# Patient Record
Sex: Female | Born: 1990 | Race: White | Hispanic: No | Marital: Single | Smoking: Never smoker
Health system: Southern US, Community
[De-identification: ages and names within clinical notes are randomized; demographics above are authoritative.]

## PROBLEM LIST (undated history)

## (undated) DIAGNOSIS — F32A Depression, unspecified: Secondary | ICD-10-CM

## (undated) DIAGNOSIS — F419 Anxiety disorder, unspecified: Secondary | ICD-10-CM

## (undated) DIAGNOSIS — E663 Overweight: Secondary | ICD-10-CM

## (undated) DIAGNOSIS — I10 Essential (primary) hypertension: Secondary | ICD-10-CM

## (undated) DIAGNOSIS — J45909 Unspecified asthma, uncomplicated: Secondary | ICD-10-CM

## (undated) HISTORY — PX: LAPAROSCOPIC GASTRIC SLEEVE RESECTION: SHX5895

## (undated) HISTORY — DX: Overweight: E66.3

## (undated) HISTORY — DX: Anxiety disorder, unspecified: F41.9

## (undated) HISTORY — DX: Depression, unspecified: F32.A

## (undated) HISTORY — PX: LASIK: SHX215

## (undated) HISTORY — DX: Essential (primary) hypertension: I10

## (undated) HISTORY — DX: Unspecified asthma, uncomplicated: J45.909

---
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
## 2012-04-22 ENCOUNTER — Other Ambulatory Visit: Payer: Self-pay

## 2012-04-22 DIAGNOSIS — R1011 Right upper quadrant pain: Secondary | ICD-10-CM

## 2012-04-23 ENCOUNTER — Ambulatory Visit
Admission: RE | Admit: 2012-04-23 | Discharge: 2012-04-23 | Disposition: A | Discharge status: Home / Self Care | Payer: BC Managed Care – PPO | Source: Ambulatory Visit

## 2012-04-23 DIAGNOSIS — R1011 Right upper quadrant pain: Secondary | ICD-10-CM

## 2015-05-24 ENCOUNTER — Other Ambulatory Visit (HOSPITAL_COMMUNITY)
Admission: RE | Admit: 2015-05-24 | Discharge: 2015-05-24 | Disposition: A | Discharge status: Home / Self Care | Payer: Managed Care, Other (non HMO) | Source: Ambulatory Visit | Attending: Obstetrics & Gynecology | Admitting: Obstetrics & Gynecology

## 2015-05-24 ENCOUNTER — Other Ambulatory Visit: Payer: Self-pay | Admitting: Obstetrics & Gynecology

## 2015-05-24 DIAGNOSIS — Z01419 Encounter for gynecological examination (general) (routine) without abnormal findings: Secondary | ICD-10-CM | POA: Insufficient documentation

## 2015-05-24 DIAGNOSIS — Z113 Encounter for screening for infections with a predominantly sexual mode of transmission: Secondary | ICD-10-CM | POA: Insufficient documentation

## 2015-06-09 LAB — CYTOLOGY - PAP

## 2015-08-20 ENCOUNTER — Other Ambulatory Visit: Payer: Self-pay | Admitting: Gynecology

## 2015-08-20 DIAGNOSIS — I10 Essential (primary) hypertension: Secondary | ICD-10-CM

## 2015-08-20 DIAGNOSIS — J4520 Mild intermittent asthma, uncomplicated: Secondary | ICD-10-CM

## 2015-08-27 ENCOUNTER — Other Ambulatory Visit: Payer: Self-pay | Admitting: Gynecology

## 2015-08-27 ENCOUNTER — Ambulatory Visit
Admission: RE | Admit: 2015-08-27 | Discharge: 2015-08-27 | Disposition: A | Discharge status: Home / Self Care | Payer: Managed Care, Other (non HMO) | Source: Ambulatory Visit | Attending: Gynecology | Admitting: Gynecology

## 2015-08-27 DIAGNOSIS — I10 Essential (primary) hypertension: Secondary | ICD-10-CM

## 2015-08-27 DIAGNOSIS — J4520 Mild intermittent asthma, uncomplicated: Secondary | ICD-10-CM

## 2016-07-06 DIAGNOSIS — Z903 Acquired absence of stomach [part of]: Secondary | ICD-10-CM | POA: Diagnosis not present

## 2016-07-17 DIAGNOSIS — J4521 Mild intermittent asthma with (acute) exacerbation: Secondary | ICD-10-CM | POA: Diagnosis not present

## 2016-07-17 DIAGNOSIS — J40 Bronchitis, not specified as acute or chronic: Secondary | ICD-10-CM | POA: Diagnosis not present

## 2016-10-25 ENCOUNTER — Encounter: Payer: Self-pay | Admitting: Gynecology

## 2016-10-28 IMAGING — RF DG UGI W/ KUB
18 of 21 series · 18 of 21 positions shown · non-contrast
Comparison: None.

CLINICAL DATA: Preop for gastric sleeve procedure.

EXAM:
UPPER GI SERIES WITH KUB
TECHNIQUE: After obtaining a scout radiograph a routine upper GI series was
performed using thin and high density barium.
FLUOROSCOPY TIME:  Radiation Exposure Index (as provided by the
fluoroscopic device): 160 dGy cm2
If the device does not provide the exposure index:
Fluoroscopy Time (in minutes and seconds):  2 minutes and 36 seconds
Number of Acquired Images:

[Series 1: run · 1 of 1 slices shown (1 of 17)]
[im 1/1]
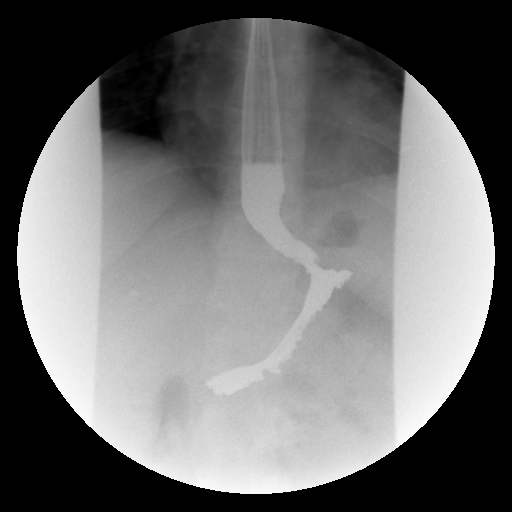

[Series 2: run · 1 of 1 slices shown (2 of 17)]
[im 1/1]
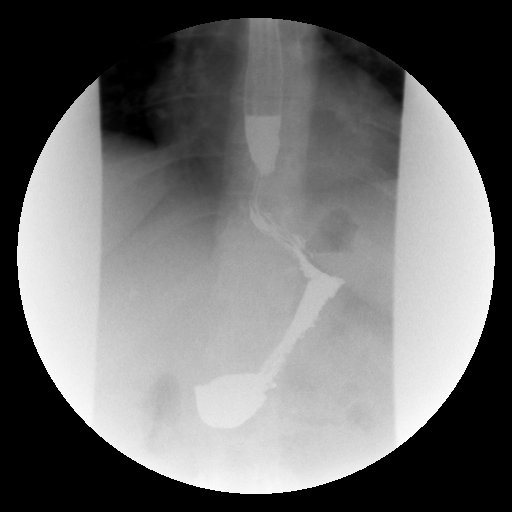

[Series 3: run · 1 of 1 slices shown (3 of 17)]
[im 1/1]
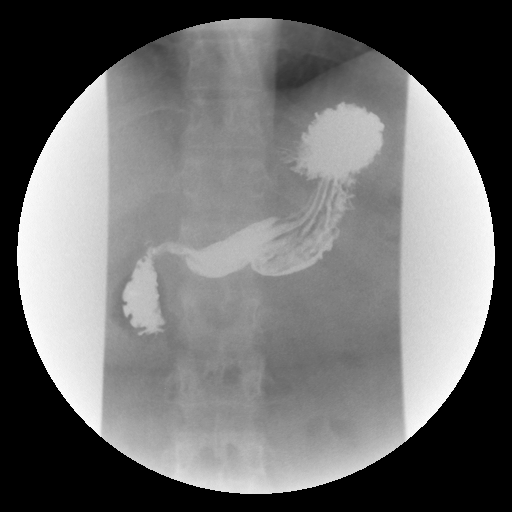

[Series 5: run · 1 of 1 slices shown (4 of 17)]
[im 1/1]
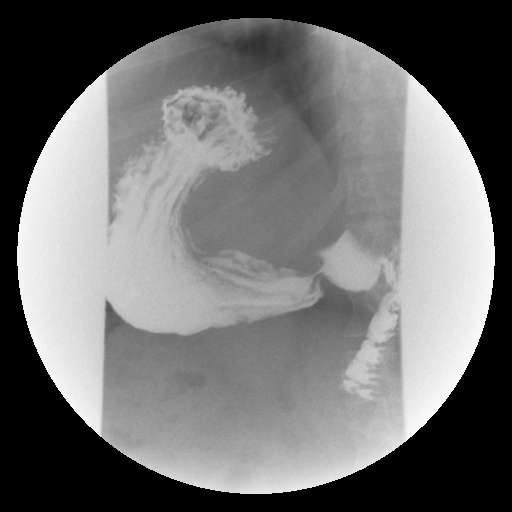

[Series 6: run · 1 of 1 slices shown (5 of 17)]
[im 1/1]
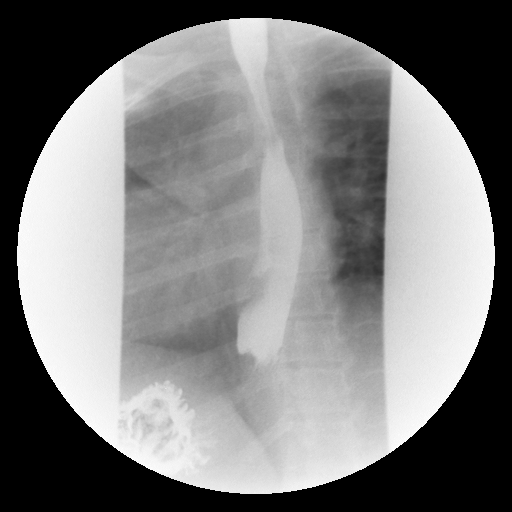

[Series 7: run · 1 of 1 slices shown (6 of 17)]
[im 1/1]
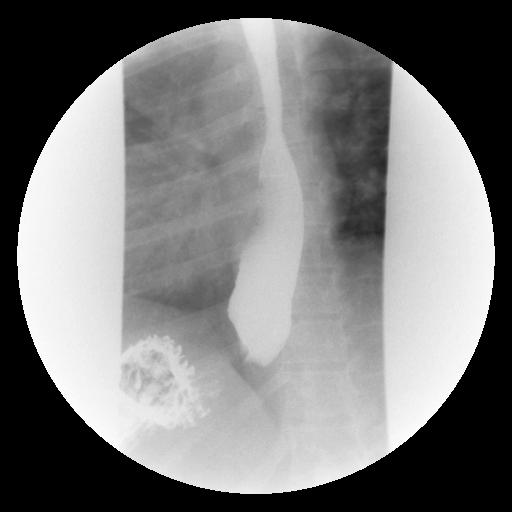

[Series 8: run · 1 of 1 slices shown (7 of 17)]
[im 1/1]
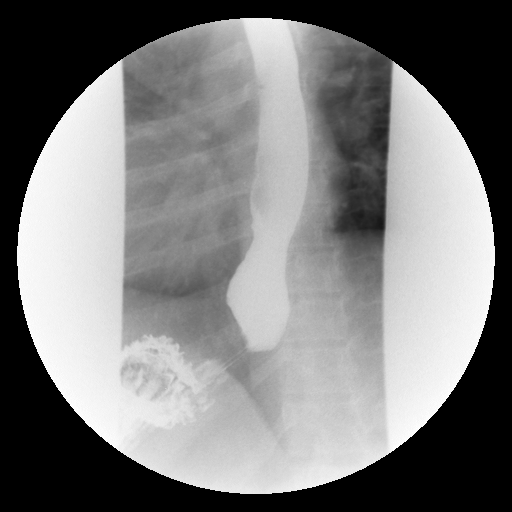

[Series 9: run · 1 of 1 slices shown (8 of 17)]
[im 1/1]
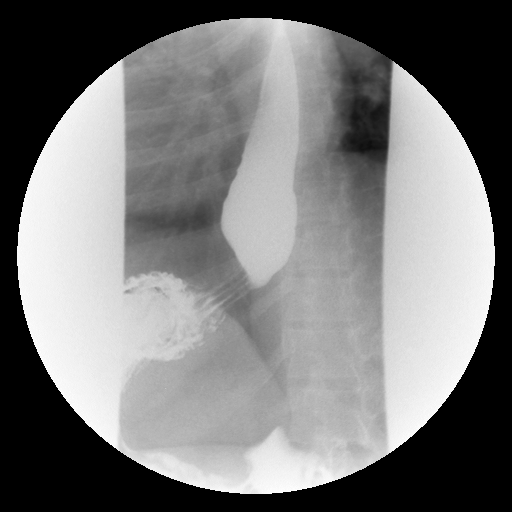

[Series 10: run · 1 of 1 slices shown (9 of 17)]
[im 1/1]
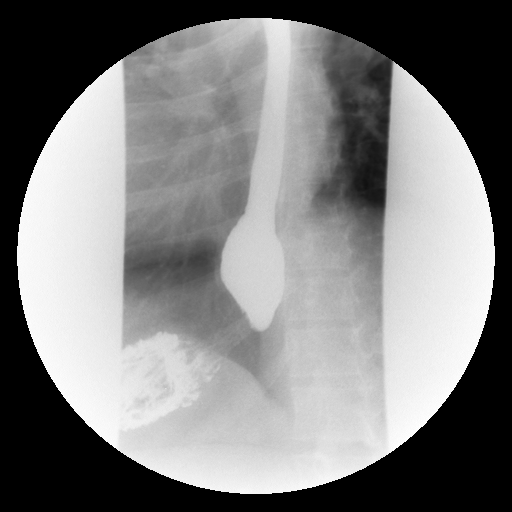

[Series 12: run · 1 of 1 slices shown (10 of 17)]
[im 1/1]
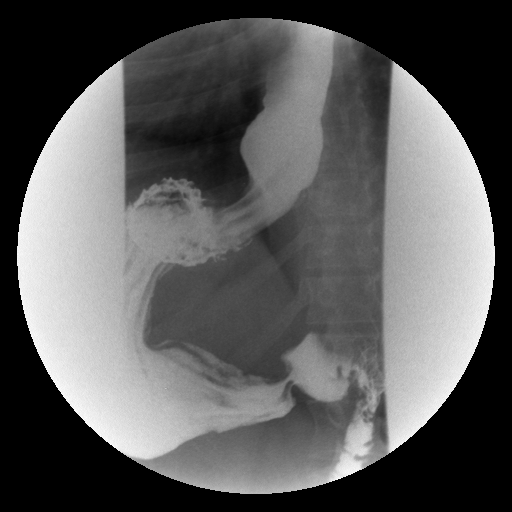

[Series 13: run · 1 of 1 slices shown (11 of 17)]
[im 1/1]
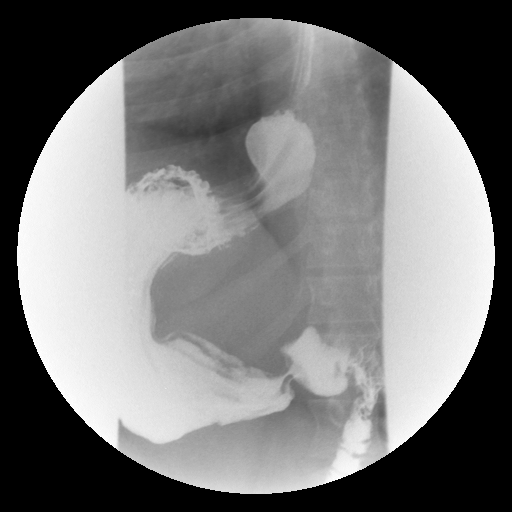

[Series 14: run · 1 of 1 slices shown (12 of 17)]
[im 1/1]
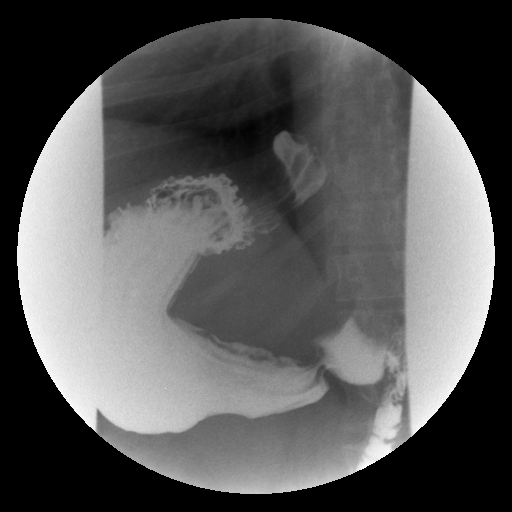

[Series 15: run · 1 of 1 slices shown (13 of 17)]
[im 1/1]
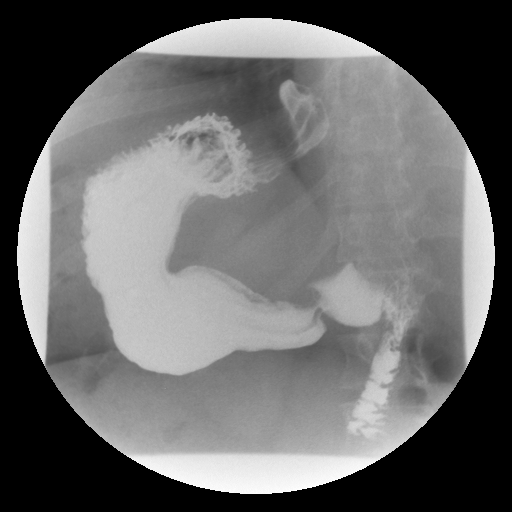

[Series 16: run · 1 of 1 slices shown (14 of 17)]
[im 1/1]
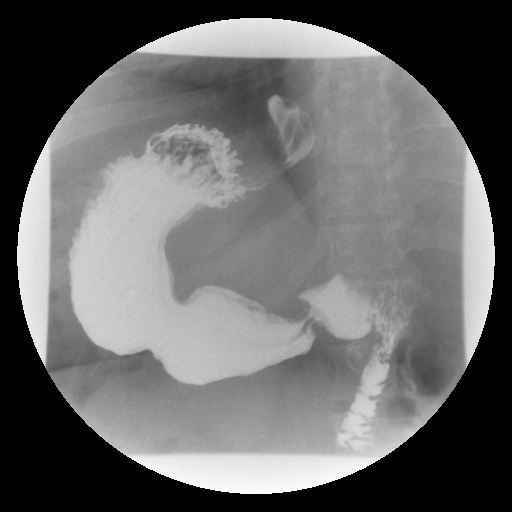

[Series 17: run · 1 of 1 slices shown (15 of 17)]
[im 1/1]
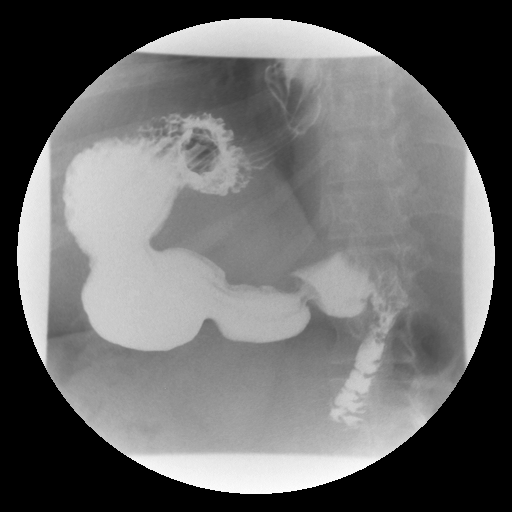

[Series 19: run · 1 of 1 slices shown (16 of 17)]
[im 1/1]
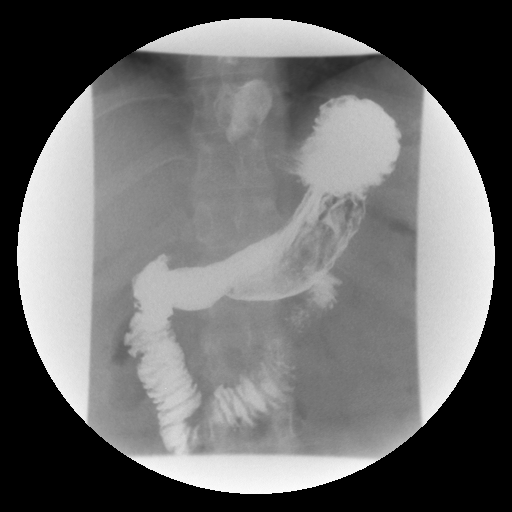

[Series 20: run · 1 of 1 slices shown (17 of 17)]
[im 1/1]
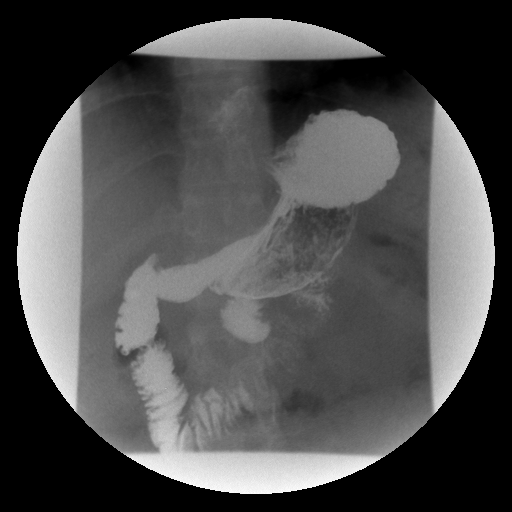

[Series 1001: view not recorded · 0.20mm/px · 1 of 1 slices shown]
[im 1/1]
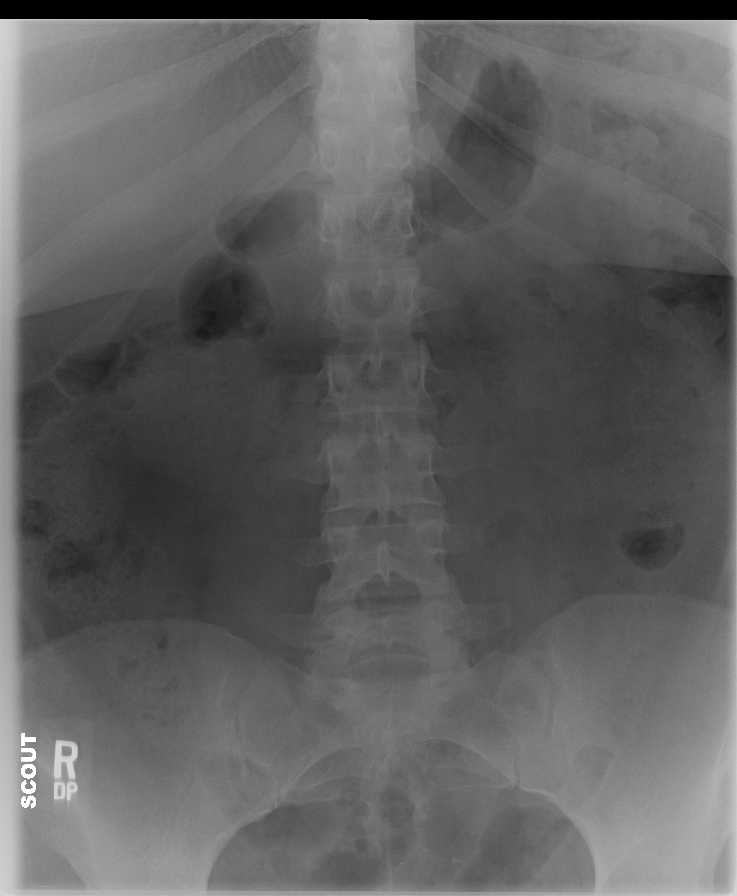

[18 of 21 positions shown; findings below may reference images not displayed]

FINDINGS: Normal esophageal motility. No intrinsic or extrinsic lesions of the
esophagus were identified. No mucosal abnormalities. There is a
small sliding-type hiatal hernia with inducible GE reflux.

The stomach, duodenum bulb and C-loop are normal. The duodenum
jejunal junction is in its normal anatomic location.
IMPRESSION: 1. Small sliding-type hiatal hernia with inducible GE reflux.
2. Normal stomach and duodenum.

## 2016-10-30 DIAGNOSIS — K5900 Constipation, unspecified: Secondary | ICD-10-CM | POA: Diagnosis not present

## 2016-11-13 DIAGNOSIS — Z903 Acquired absence of stomach [part of]: Secondary | ICD-10-CM | POA: Diagnosis not present

## 2016-11-19 DIAGNOSIS — J020 Streptococcal pharyngitis: Secondary | ICD-10-CM | POA: Diagnosis not present

## 2017-01-22 DIAGNOSIS — J029 Acute pharyngitis, unspecified: Secondary | ICD-10-CM | POA: Diagnosis not present

## 2017-01-22 DIAGNOSIS — J028 Acute pharyngitis due to other specified organisms: Secondary | ICD-10-CM | POA: Diagnosis not present

## 2017-08-03 DIAGNOSIS — Z01419 Encounter for gynecological examination (general) (routine) without abnormal findings: Secondary | ICD-10-CM | POA: Diagnosis not present

## 2017-09-05 DIAGNOSIS — Z0000 Encounter for general adult medical examination without abnormal findings: Secondary | ICD-10-CM | POA: Diagnosis not present

## 2017-09-25 DIAGNOSIS — R632 Polyphagia: Secondary | ICD-10-CM | POA: Diagnosis not present

## 2017-09-25 DIAGNOSIS — Z9884 Bariatric surgery status: Secondary | ICD-10-CM | POA: Diagnosis not present

## 2017-11-09 DIAGNOSIS — Z9884 Bariatric surgery status: Secondary | ICD-10-CM | POA: Diagnosis not present

## 2017-11-09 DIAGNOSIS — Z713 Dietary counseling and surveillance: Secondary | ICD-10-CM | POA: Diagnosis not present

## 2018-01-02 DIAGNOSIS — J039 Acute tonsillitis, unspecified: Secondary | ICD-10-CM | POA: Diagnosis not present

## 2018-02-01 DIAGNOSIS — Z9884 Bariatric surgery status: Secondary | ICD-10-CM | POA: Diagnosis not present

## 2018-04-05 DIAGNOSIS — Z903 Acquired absence of stomach [part of]: Secondary | ICD-10-CM | POA: Diagnosis not present

## 2018-04-05 DIAGNOSIS — Z9884 Bariatric surgery status: Secondary | ICD-10-CM | POA: Diagnosis not present

## 2018-07-25 DIAGNOSIS — I10 Essential (primary) hypertension: Secondary | ICD-10-CM | POA: Diagnosis not present

## 2018-07-25 DIAGNOSIS — M545 Low back pain: Secondary | ICD-10-CM | POA: Diagnosis not present

## 2018-07-25 DIAGNOSIS — F411 Generalized anxiety disorder: Secondary | ICD-10-CM | POA: Diagnosis not present

## 2018-08-09 DIAGNOSIS — Z713 Dietary counseling and surveillance: Secondary | ICD-10-CM | POA: Diagnosis not present

## 2018-08-09 DIAGNOSIS — Z9884 Bariatric surgery status: Secondary | ICD-10-CM | POA: Diagnosis not present

## 2018-08-09 DIAGNOSIS — R635 Abnormal weight gain: Secondary | ICD-10-CM | POA: Diagnosis not present

## 2018-08-09 DIAGNOSIS — R030 Elevated blood-pressure reading, without diagnosis of hypertension: Secondary | ICD-10-CM | POA: Diagnosis not present

## 2021-12-01 ENCOUNTER — Other Ambulatory Visit (HOSPITAL_COMMUNITY): Payer: Self-pay

## 2021-12-01 MED ORDER — WEGOVY 1 MG/0.5ML ~~LOC~~ SOAJ
1.0000 mg | SUBCUTANEOUS | 0 refills | Status: DC
  Filled 2021-12-01: qty 2, 28d supply, fill #0

## 2021-12-02 ENCOUNTER — Other Ambulatory Visit (HOSPITAL_COMMUNITY): Payer: Self-pay

## 2021-12-02 MED ORDER — WEGOVY 1 MG/0.5ML ~~LOC~~ SOAJ
SUBCUTANEOUS | 0 refills | Status: DC
Start: 2021-12-02 — End: 2022-11-02
  Filled 2021-12-02 (×2): qty 2, 28d supply, fill #0

## 2021-12-28 ENCOUNTER — Other Ambulatory Visit (HOSPITAL_COMMUNITY): Payer: Self-pay

## 2021-12-28 MED ORDER — WEGOVY 1.7 MG/0.75ML ~~LOC~~ SOAJ
SUBCUTANEOUS | 0 refills | Status: DC
Start: 2021-12-28 — End: 2022-11-02
  Filled 2021-12-28: qty 3, 28d supply, fill #0

## 2022-01-19 ENCOUNTER — Other Ambulatory Visit (HOSPITAL_COMMUNITY): Payer: Self-pay

## 2022-01-19 MED ORDER — WEGOVY 2.4 MG/0.75ML ~~LOC~~ SOAJ
SUBCUTANEOUS | 2 refills | Status: DC
  Filled 2022-01-19 (×4): qty 3, 28d supply, fill #0
  Filled 2022-02-13: qty 3, 28d supply, fill #1
  Filled 2022-03-12: qty 3, 28d supply, fill #2

## 2022-01-20 ENCOUNTER — Other Ambulatory Visit (HOSPITAL_COMMUNITY): Payer: Self-pay

## 2022-02-14 ENCOUNTER — Other Ambulatory Visit (HOSPITAL_COMMUNITY): Payer: Self-pay

## 2022-02-17 ENCOUNTER — Other Ambulatory Visit (HOSPITAL_COMMUNITY): Payer: Self-pay

## 2022-03-16 ENCOUNTER — Other Ambulatory Visit (HOSPITAL_COMMUNITY): Payer: Self-pay

## 2022-03-17 ENCOUNTER — Other Ambulatory Visit (HOSPITAL_COMMUNITY): Payer: Self-pay

## 2022-04-08 ENCOUNTER — Other Ambulatory Visit (HOSPITAL_COMMUNITY): Payer: Self-pay

## 2022-04-14 ENCOUNTER — Other Ambulatory Visit (HOSPITAL_COMMUNITY): Payer: Self-pay

## 2022-04-14 MED ORDER — WEGOVY 2.4 MG/0.75ML ~~LOC~~ SOAJ
0.7500 mL | SUBCUTANEOUS | 2 refills | Status: DC
  Filled 2022-04-14 (×2): qty 3, 28d supply, fill #0
  Filled 2022-05-08: qty 3, 28d supply, fill #1
  Filled 2022-06-03: qty 3, 28d supply, fill #2

## 2022-05-11 ENCOUNTER — Other Ambulatory Visit (HOSPITAL_COMMUNITY): Payer: Self-pay

## 2022-06-01 ENCOUNTER — Other Ambulatory Visit (HOSPITAL_COMMUNITY): Payer: Self-pay

## 2022-06-01 MED ORDER — WEGOVY 2.4 MG/0.75ML ~~LOC~~ SOAJ
SUBCUTANEOUS | 2 refills | Status: DC
  Filled 2022-06-01 – 2022-06-30 (×2): qty 3, 28d supply, fill #0
  Filled 2022-07-28: qty 3, 28d supply, fill #1
  Filled 2022-08-25: qty 3, 28d supply, fill #2

## 2022-06-06 ENCOUNTER — Other Ambulatory Visit: Payer: Self-pay

## 2022-06-08 ENCOUNTER — Other Ambulatory Visit: Payer: Self-pay

## 2022-06-30 ENCOUNTER — Other Ambulatory Visit (HOSPITAL_COMMUNITY): Payer: Self-pay

## 2022-07-03 ENCOUNTER — Other Ambulatory Visit (HOSPITAL_COMMUNITY): Payer: Self-pay

## 2022-07-28 ENCOUNTER — Other Ambulatory Visit (HOSPITAL_COMMUNITY): Payer: Self-pay

## 2022-07-31 ENCOUNTER — Other Ambulatory Visit (HOSPITAL_COMMUNITY): Payer: Self-pay

## 2022-08-28 ENCOUNTER — Other Ambulatory Visit (HOSPITAL_COMMUNITY): Payer: Self-pay

## 2022-08-28 ENCOUNTER — Other Ambulatory Visit: Payer: Self-pay

## 2022-09-22 ENCOUNTER — Other Ambulatory Visit (HOSPITAL_COMMUNITY): Payer: Self-pay

## 2022-09-22 MED ORDER — WEGOVY 2.4 MG/0.75ML ~~LOC~~ SOAJ
2.4000 mg | SUBCUTANEOUS | 2 refills | Status: DC
  Filled 2022-09-22: qty 3, 28d supply, fill #0
  Filled 2022-10-22: qty 3, 28d supply, fill #1
  Filled 2022-11-16: qty 3, 28d supply, fill #2

## 2022-09-25 ENCOUNTER — Other Ambulatory Visit (HOSPITAL_COMMUNITY): Payer: Self-pay

## 2022-10-23 ENCOUNTER — Other Ambulatory Visit (HOSPITAL_COMMUNITY): Payer: Self-pay

## 2022-10-23 ENCOUNTER — Other Ambulatory Visit: Payer: Self-pay

## 2022-10-27 ENCOUNTER — Other Ambulatory Visit (HOSPITAL_COMMUNITY): Payer: Self-pay

## 2022-11-02 ENCOUNTER — Ambulatory Visit (INDEPENDENT_AMBULATORY_CARE_PROVIDER_SITE_OTHER): Payer: Commercial Managed Care - PPO | Admitting: Nurse Practitioner

## 2022-11-02 ENCOUNTER — Encounter: Payer: Self-pay | Admitting: Nurse Practitioner

## 2022-11-02 ENCOUNTER — Other Ambulatory Visit (HOSPITAL_COMMUNITY)
Admission: RE | Admit: 2022-11-02 | Discharge: 2022-11-02 | Disposition: A | Discharge status: Home / Self Care | Payer: Commercial Managed Care - PPO | Source: Ambulatory Visit | Attending: Nurse Practitioner | Admitting: Nurse Practitioner

## 2022-11-02 VITALS — BP 114/78 | Ht 65.0 in | Wt 250.0 lb

## 2022-11-02 DIAGNOSIS — Z01419 Encounter for gynecological examination (general) (routine) without abnormal findings: Secondary | ICD-10-CM

## 2022-11-02 DIAGNOSIS — Z124 Encounter for screening for malignant neoplasm of cervix: Secondary | ICD-10-CM | POA: Diagnosis present

## 2022-11-02 DIAGNOSIS — Z3009 Encounter for other general counseling and advice on contraception: Secondary | ICD-10-CM

## 2022-11-02 DIAGNOSIS — Z113 Encounter for screening for infections with a predominantly sexual mode of transmission: Secondary | ICD-10-CM

## 2022-11-02 DIAGNOSIS — Z30015 Encounter for initial prescription of vaginal ring hormonal contraceptive: Secondary | ICD-10-CM | POA: Diagnosis not present

## 2022-11-02 MED ORDER — ETONOGESTREL-ETHINYL ESTRADIOL 0.12-0.015 MG/24HR VA RING
1.0000 | VAGINAL_RING | VAGINAL | 3 refills | Status: DC
Start: 2022-11-02 — End: 2023-11-15

## 2022-11-02 NOTE — Progress Notes (Signed)
Suzanne Farrell 08/25/1990 [id number]   History:  32 y.o. G0 presents as new patient to establish care. Monthly cycles. Interested in birth control. Used Nuvaring in the past and liked it. Also was on COCs but had elevated blood pressures at the time. No issues with blood pressure for years, never medicated, down 50 pounds. Had irregular spotting with mini pill. On Wegovy. 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 Anxiety, depression managed by PCP.   Gynecologic History Patient's last menstrual period was 10/25/2022 (exact date). Period Cycle (Days): 28 Period Duration (Days): 5 Period Pattern: Regular Menstrual Flow: Moderate Menstrual Control: Tampon, Panty liner Dysmenorrhea: (!) Mild Dysmenorrhea Symptoms: Cramping Contraception/Family planning: none Sexually active: Yes  Health Maintenance Last Pap: 05/24/2015. Results were: Normal Last mammogram: Not indicated Last colonoscopy: Not indicated Last Dexa: Not indicated   Past medical history, past surgical history, family history and social history were all reviewed and documented in the EPIC chart. Works for [employer].   ROS:  A ROS was performed and pertinent positives and negatives are included.  Exam:  Vitals:   11/02/22 1047  BP: 114/78  Weight: 250 lb (113.4 kg)  Height: 5\' 5"  (1.651 m)   Body mass index is 41.6 kg/m.  General appearance:  Normal Thyroid:  Symmetrical, normal in size, without palpable masses or nodularity. Respiratory  Auscultation:  Clear without wheezing or rhonchi Cardiovascular  Auscultation:  Regular rate, without rubs, murmurs or gallops  Edema/varicosities:  Not grossly evident Abdominal  Soft,nontender, without masses, guarding or rebound.  Liver/spleen:  No organomegaly noted  Hernia:  None appreciated  Skin  Inspection:  Grossly normal Breasts: Examined lying and sitting.   Right: Without masses, retractions, nipple discharge or axillary adenopathy.   Left: Without masses,  retractions, nipple discharge or axillary adenopathy. Genitourinary   Inguinal/mons:  Normal without inguinal adenopathy  External genitalia:  Normal appearing vulva with no masses, tenderness, or lesions  BUS/Urethra/Skene's glands:  Normal  Vagina:  Normal appearing with normal color and discharge, no lesions  Cervix:  Normal appearing without discharge or lesions  Uterus:  Normal in size, shape and contour.  Midline and mobile, nontender  Adnexa/parametria:     Rt: Normal in size, without masses or tenderness.   Lt: Normal in size, without masses or tenderness.  Anus and perineum: Normal  Digital rectal exam: Deferred  Patient informed chaperone available to be present for breast and pelvic exam. Patient has requested no chaperone to be present. Patient has been advised what will be completed during breast and pelvic exam.   Assessment/Plan:  32 y.o. G0 to establish care and discuss birth control.   Well female exam with routine gynecological exam - Education provided on SBEs, importance of preventative screenings, current guidelines, high calcium diet, regular exercise, and multivitamin daily. Labs with PCP.   Screening for cervical cancer - Plan: Cytology - PAP( [place]). Normal pap history.   Screening examination for STD (sexually transmitted disease) - Plan: Cytology - PAP( [place]). GC/CT/trich added to pap.   General counseling and advice on female contraception - Contraceptive options were reviewed, including hormonal methods, both combination (pill, patch, vaginal ring) and progesterone-only (pill, Depo Provera and Nexplanon), intrauterine devices (Mirena, Soper, [place], and Cactus), Phexxi, barrier methods (condoms, diaphragm) and female/female sterilization. The mechanisms, risks, benefits and side effects of all methods were discussed. Interested in COCs and Nuvaring. COCs not recommended due to malabsorption with GLP-1.   Encounter for initial prescription of  vaginal ring hormonal contraceptive -  Plan: etonogestrel-ethinyl estradiol (NUVARING) 0.12-0.015 MG/24HR vaginal ring every 28 days. Educated on proper use. May use continuously, has done this in the past. Will start with first day of next menses. Will monitor blood pressure.   Return in 1 year for annual.     Olivia Mackie DNP, 11:00 AM 11/02/2022

## 2022-11-09 LAB — CYTOLOGY - PAP
Chlamydia: NEGATIVE
Comment: NEGATIVE
Comment: NEGATIVE
Comment: NEGATIVE
Comment: NORMAL
Diagnosis: NEGATIVE
High risk HPV: NEGATIVE
Neisseria Gonorrhea: NEGATIVE
Trichomonas: NEGATIVE

## 2022-11-18 ENCOUNTER — Other Ambulatory Visit (HOSPITAL_COMMUNITY): Payer: Self-pay

## 2022-11-21 ENCOUNTER — Other Ambulatory Visit: Payer: Self-pay

## 2022-11-21 ENCOUNTER — Other Ambulatory Visit (HOSPITAL_COMMUNITY): Payer: Self-pay

## 2022-11-23 ENCOUNTER — Other Ambulatory Visit (HOSPITAL_COMMUNITY): Payer: Self-pay

## 2022-12-08 ENCOUNTER — Other Ambulatory Visit (HOSPITAL_COMMUNITY): Payer: Self-pay

## 2022-12-08 MED ORDER — WEGOVY 2.4 MG/0.75ML ~~LOC~~ SOAJ
SUBCUTANEOUS | 5 refills | Status: DC
  Filled 2022-12-08: qty 3, 28d supply, fill #0
  Filled 2023-01-12: qty 3, 28d supply, fill #1
  Filled 2023-02-09: qty 3, 28d supply, fill #2
  Filled 2023-03-06: qty 3, 28d supply, fill #3
  Filled 2023-04-05: qty 3, 28d supply, fill #4
  Filled 2023-05-03: qty 3, 28d supply, fill #5

## 2022-12-15 ENCOUNTER — Other Ambulatory Visit (HOSPITAL_COMMUNITY): Payer: Self-pay

## 2023-01-12 ENCOUNTER — Other Ambulatory Visit: Payer: Self-pay

## 2023-01-13 ENCOUNTER — Other Ambulatory Visit (HOSPITAL_COMMUNITY): Payer: Self-pay

## 2023-01-15 ENCOUNTER — Other Ambulatory Visit (HOSPITAL_COMMUNITY): Payer: Self-pay

## 2023-02-14 ENCOUNTER — Encounter (HOSPITAL_COMMUNITY): Payer: Self-pay

## 2023-02-15 ENCOUNTER — Other Ambulatory Visit (HOSPITAL_COMMUNITY): Payer: Self-pay

## 2023-02-15 ENCOUNTER — Other Ambulatory Visit: Payer: Self-pay

## 2023-02-16 ENCOUNTER — Other Ambulatory Visit (HOSPITAL_COMMUNITY): Payer: Self-pay

## 2023-03-07 ENCOUNTER — Other Ambulatory Visit: Payer: Self-pay

## 2023-03-07 ENCOUNTER — Other Ambulatory Visit (HOSPITAL_COMMUNITY): Payer: Self-pay

## 2023-04-09 ENCOUNTER — Other Ambulatory Visit (HOSPITAL_COMMUNITY): Payer: Self-pay

## 2023-04-09 ENCOUNTER — Other Ambulatory Visit: Payer: Self-pay

## 2023-04-12 ENCOUNTER — Other Ambulatory Visit (HOSPITAL_COMMUNITY): Payer: Self-pay

## 2023-05-07 ENCOUNTER — Other Ambulatory Visit: Payer: Self-pay

## 2023-05-07 ENCOUNTER — Other Ambulatory Visit (HOSPITAL_COMMUNITY): Payer: Self-pay

## 2023-06-04 ENCOUNTER — Other Ambulatory Visit (HOSPITAL_COMMUNITY): Payer: Self-pay

## 2023-06-04 MED ORDER — WEGOVY 2.4 MG/0.75ML ~~LOC~~ SOAJ
2.4000 mg | SUBCUTANEOUS | 5 refills | Status: DC
  Filled 2023-06-04: qty 3, 28d supply, fill #0
  Filled 2023-07-04: qty 3, 28d supply, fill #1
  Filled 2023-07-26 – 2023-07-30 (×4): qty 3, 28d supply, fill #2
  Filled 2023-08-23: qty 3, 28d supply, fill #3
  Filled 2023-09-20 – 2023-09-21 (×2): qty 3, 28d supply, fill #4
  Filled 2023-09-28: qty 3, 28d supply, fill #5

## 2023-07-05 ENCOUNTER — Other Ambulatory Visit (HOSPITAL_COMMUNITY): Payer: Self-pay

## 2023-07-06 ENCOUNTER — Other Ambulatory Visit (HOSPITAL_COMMUNITY): Payer: Self-pay

## 2023-07-17 ENCOUNTER — Other Ambulatory Visit (HOSPITAL_COMMUNITY): Payer: Self-pay

## 2023-07-27 ENCOUNTER — Other Ambulatory Visit (HOSPITAL_COMMUNITY): Payer: Self-pay

## 2023-07-30 ENCOUNTER — Other Ambulatory Visit (HOSPITAL_COMMUNITY): Payer: Self-pay

## 2023-08-31 ENCOUNTER — Other Ambulatory Visit (HOSPITAL_COMMUNITY): Payer: Self-pay

## 2023-09-21 ENCOUNTER — Other Ambulatory Visit (HOSPITAL_COMMUNITY): Payer: Self-pay

## 2023-09-22 ENCOUNTER — Other Ambulatory Visit (HOSPITAL_COMMUNITY): Payer: Self-pay

## 2023-09-24 ENCOUNTER — Other Ambulatory Visit (HOSPITAL_COMMUNITY): Payer: Self-pay

## 2023-09-24 LAB — LAB REPORT - SCANNED
EGFR (Non-African Amer.): 109
TSH: 1.47 (ref 0.41–5.90)

## 2023-09-28 ENCOUNTER — Other Ambulatory Visit (HOSPITAL_COMMUNITY): Payer: Self-pay

## 2023-10-03 ENCOUNTER — Encounter: Payer: Self-pay | Admitting: Family Medicine

## 2023-10-11 ENCOUNTER — Other Ambulatory Visit (HOSPITAL_COMMUNITY): Payer: Self-pay

## 2023-10-12 ENCOUNTER — Other Ambulatory Visit (HOSPITAL_COMMUNITY): Payer: Self-pay

## 2023-10-12 MED ORDER — WEGOVY 2.4 MG/0.75ML ~~LOC~~ SOAJ
2.4000 mg | SUBCUTANEOUS | 1 refills | Status: DC
  Filled 2023-10-12: qty 3, 28d supply, fill #0
  Filled 2023-11-16: qty 3, 28d supply, fill #1

## 2023-10-15 ENCOUNTER — Other Ambulatory Visit (HOSPITAL_COMMUNITY): Payer: Self-pay

## 2023-11-15 ENCOUNTER — Ambulatory Visit (INDEPENDENT_AMBULATORY_CARE_PROVIDER_SITE_OTHER): Admitting: Nurse Practitioner

## 2023-11-15 ENCOUNTER — Encounter: Payer: Self-pay | Admitting: Nurse Practitioner

## 2023-11-15 VITALS — BP 110/76 | HR 70 | Resp 16 | Ht 64.75 in | Wt 227.0 lb

## 2023-11-15 DIAGNOSIS — Z01419 Encounter for gynecological examination (general) (routine) without abnormal findings: Secondary | ICD-10-CM

## 2023-11-15 DIAGNOSIS — Z1331 Encounter for screening for depression: Secondary | ICD-10-CM

## 2023-11-15 NOTE — Progress Notes (Signed)
   Suzanne Farrell 05-20-1991 [id number]   History:  33 y.o. G0 presents for annual exam. Monthly cycles. Stopped Nuvaring due to elevated blood pressures. Not interested in contraception at this time. On Wegovy . 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 Anxiety, depression managed by PCP.   Gynecologic History Patient's last menstrual period was 11/13/2023 (exact date). Period Duration (Days): 4-5 Period Pattern: Regular Menstrual Flow: Moderate Menstrual Control: Maxi pad, Tampon Dysmenorrhea: (!) Mild Dysmenorrhea Symptoms: Cramping Contraception/Family planning: condoms Sexually active: Yes  Health Maintenance Last Pap: 11/02/2022. Results were: Normal neg HPV Last mammogram: Not indicated Last colonoscopy: Not indicated Last Dexa: Not indicated      11/15/2023    9:00 AM  Depression screen PHQ 2/9  Decreased Interest 0  Down, Depressed, Hopeless 0  PHQ - 2 Score 0     Past medical history, past surgical history, family history and social history were all reviewed and documented in the EPIC chart. Works for [employer]. Getting married in November.   ROS:  A ROS was performed and pertinent positives and negatives are included.  Exam:  Vitals:   11/15/23 0856  BP: 110/76  Pulse: 70  Resp: 16  Weight: 227 lb (103 kg)  Height: 5' 4.75" (1.645 m)    Body mass index is 38.07 kg/m.  General appearance:  Normal Thyroid:  Symmetrical, normal in size, without palpable masses or nodularity. Respiratory  Auscultation:  Clear without wheezing or rhonchi Cardiovascular  Auscultation:  Regular rate, without rubs, murmurs or gallops  Edema/varicosities:  Not grossly evident Abdominal  Soft,nontender, without masses, guarding or rebound.  Liver/spleen:  No organomegaly noted  Hernia:  None appreciated  Skin  Inspection:  Grossly normal Breasts: Examined lying and sitting.   Right: Without masses, retractions, nipple discharge or axillary adenopathy.   Left: Without masses,  retractions, nipple discharge or axillary adenopathy. Pelvic: External genitalia:  no lesions              Urethra:  normal appearing urethra with no masses, tenderness or lesions              Bartholins and Skenes: normal                 Vagina: normal appearing vagina with normal color and discharge, no lesions              Cervix: no lesions Bimanual Exam:  Uterus:  no masses or tenderness              Adnexa: no mass, fullness, tenderness              Rectovaginal: Deferred              Anus:  normal, no lesions  Arvell Birchwood, CMA present as chaperone.   Assessment/Plan:  33 y.o. G0 for annual exam.   Well female exam with routine gynecological exam - Education provided on SBEs, importance of preventative screenings, current guidelines, high calcium diet, regular exercise, and multivitamin daily. Labs with PCP.   Screening for cervical cancer - Normal pap history. Will repeat at 5-year interval per guidelines.   Return in about 1 year (around 11/14/2024) for Annual.    Andee Bamberger DNP, 9:16 AM 11/15/2023

## 2023-11-19 ENCOUNTER — Other Ambulatory Visit (HOSPITAL_COMMUNITY): Payer: Self-pay

## 2024-11-17 ENCOUNTER — Ambulatory Visit: Admitting: Nurse Practitioner

## 2024-11-21 ENCOUNTER — Ambulatory Visit: Admitting: Nurse Practitioner
